# Patient Record
Sex: Male | Born: 1980 | Race: White | Hispanic: No | Marital: Single | Smoking: Never smoker
Health system: Southern US, Community
[De-identification: ages and names within clinical notes are randomized; demographics above are authoritative.]

## PROBLEM LIST (undated history)

## (undated) DIAGNOSIS — N200 Calculus of kidney: Secondary | ICD-10-CM

## (undated) HISTORY — PX: LITHOTRIPSY: SUR834

---

## 2010-12-28 ENCOUNTER — Emergency Department (EMERGENCY_DEPARTMENT_HOSPITAL): Payer: BC Managed Care – PPO

## 2010-12-28 ENCOUNTER — Emergency Department (HOSPITAL_COMMUNITY): Admission: EM | Admit: 2010-12-28 | Disposition: A | Discharge status: Home / Self Care | Payer: BC Managed Care – PPO

## 2011-01-10 ENCOUNTER — Emergency Department (EMERGENCY_DEPARTMENT_HOSPITAL): Admission: EM | Admit: 2011-01-10 | Discharge: 2011-01-10 | Payer: BC Managed Care – PPO

## 2011-01-10 ENCOUNTER — Emergency Department (EMERGENCY_DEPARTMENT_HOSPITAL): Payer: BC Managed Care – PPO

## 2011-05-01 ENCOUNTER — Encounter (FREE_STANDING_LABORATORY_FACILITY)
Admit: 2011-05-01 | Discharge: 2011-05-01 | Disposition: A | Discharge status: Home / Self Care | Payer: Self-pay | Attending: Physician Assistant | Admitting: Physician Assistant

## 2011-05-01 DIAGNOSIS — Z7251 High risk heterosexual behavior: Secondary | ICD-10-CM | POA: Insufficient documentation

## 2011-05-02 LAB — COMPREHENSIVE METABOLIC PANEL, NON-FASTING
ALBUMIN: 4.3 g/dL (ref 3.5–4.8)
ALKALINE PHOSPHATASE: 79 U/L (ref 38–126)
ALT (SGPT): 32 U/L (ref 7–55)
ANION GAP: 7 mmol/L (ref 5–16)
AST (SGOT): 23 U/L (ref 8–48)
BILIRUBIN, TOTAL: 0.5 mg/dL (ref 0.3–1.3)
BUN/CREAT RATIO: 8 (ref 6–22)
BUN: 7 mg/dL — ABNORMAL LOW (ref 8–26)
CALCIUM: 9.7 mg/dL (ref 8.5–10.4)
CARBON DIOXIDE: 28 mmol/L (ref 23–33)
CHLORIDE: 105 mmol/L (ref 96–111)
CREATININE: 0.84 mg/dL (ref 0.62–1.27)
ESTIMATED GLOMERULAR FILTRATION RATE: >59 ml/min/1.73m2 (ref >59)
GLUCOSE,NONFAST: 86 mg/dL (ref 65–139)
POTASSIUM: 4.1 mmol/L (ref 3.5–5.1)
SODIUM: 140 mmol/L (ref 136–145)
TOTAL PROTEIN: 7.4 g/dL (ref 6.4–8.3)

## 2011-05-02 LAB — HIV1/HIV2 SCREEN, COMBINED ANTIGEN AND ANTIBODY: HIV SCREEN, COMBINED ANTIGEN & ANTIBODY: NEGATIVE

## 2011-05-02 LAB — HEPATITIS C ANTIBODY SCREEN WITH REFLEX TO HCV PCR: HEPATITIS C ANTIBODY: NEGATIVE

## 2017-09-17 ENCOUNTER — Encounter (HOSPITAL_COMMUNITY): Payer: Self-pay

## 2017-09-17 ENCOUNTER — Emergency Department
Admission: EM | Admit: 2017-09-17 | Discharge: 2017-09-17 | Disposition: A | Discharge status: Home / Self Care | Payer: Self-pay | Attending: Emergency Medicine | Admitting: Emergency Medicine

## 2017-09-17 ENCOUNTER — Emergency Department (HOSPITAL_COMMUNITY): Payer: Self-pay

## 2017-09-17 DIAGNOSIS — Z87442 Personal history of urinary calculi: Secondary | ICD-10-CM | POA: Insufficient documentation

## 2017-09-17 DIAGNOSIS — N200 Calculus of kidney: Secondary | ICD-10-CM | POA: Insufficient documentation

## 2017-09-17 DIAGNOSIS — F17200 Nicotine dependence, unspecified, uncomplicated: Secondary | ICD-10-CM | POA: Insufficient documentation

## 2017-09-17 DIAGNOSIS — F17210 Nicotine dependence, cigarettes, uncomplicated: Secondary | ICD-10-CM

## 2017-09-17 HISTORY — DX: Calculus of kidney: N20.0

## 2017-09-17 LAB — GOLD TOP TUBE

## 2017-09-17 LAB — COMPREHENSIVE METABOLIC PANEL, NON-FASTING
ALBUMIN: 4.2 g/dL (ref 3.5–4.8)
ALKALINE PHOSPHATASE: 71 U/L (ref 38–126)
ALT (SGPT): 50 U/L (ref 17–63)
ANION GAP: 11 mmol/L (ref 4–13)
AST (SGOT): 34 U/L (ref 15–41)
BILIRUBIN TOTAL: 0.7 mg/dL (ref 0.2–2.0)
BUN: 15 mg/dL (ref 8–20)
CALCIUM: 9.3 mg/dL (ref 8.9–10.3)
CHLORIDE: 108 mmol/L (ref 101–111)
CO2 TOTAL: 20 mmol/L — ABNORMAL LOW (ref 22–32)
CREATININE: 1.05 mg/dL (ref 0.80–1.40)
ESTIMATED GFR: >60 mL/min/1.73mˆ2 (ref >60)
GLUCOSE: 111 mg/dL — ABNORMAL HIGH (ref 70–99)
POTASSIUM: 4.2 mmol/L (ref 3.6–5.1)
PROTEIN TOTAL: 7.3 g/dL (ref 6.1–7.9)
SODIUM: 139 mmol/L (ref 135–145)

## 2017-09-17 LAB — CBC WITH DIFF
BASOPHIL #: 0 x10ˆ3/uL (ref 0.00–0.30)
BASOPHIL %: 1 % (ref 0–1)
EOSINOPHIL #: 0.1 x10ˆ3/uL (ref 0.00–0.50)
EOSINOPHIL %: 1 % (ref 0–4)
HCT: 42 % (ref 42.0–52.0)
HCT: 42 % (ref 42.0–52.0)
HGB: 14.8 g/dL (ref 13.5–18.0)
LYMPHOCYTE #: 1.6 x10ˆ3/uL (ref 0.90–2.90)
LYMPHOCYTE %: 26 % (ref 23–35)
MCH: 29.5 pg (ref 28.0–33.0)
MCHC: 35.2 g/dL (ref 32.0–37.0)
MCV: 83.8 fL (ref 78.0–100.0)
MONOCYTE #: 0.4 x10ˆ3/uL (ref 0.30–0.90)
MONOCYTE %: 6 % (ref 0–12)
NEUTROPHIL #: 4.1 x10ˆ3/uL (ref 1.70–7.00)
NEUTROPHIL %: 66 % (ref 50–70)
NEUTROPHIL %: 66 % (ref 50–70)
PLATELETS: 336 x10ˆ3/uL (ref 130–400)
RBC: 5.02 x10ˆ6/uL (ref 4.70–5.40)
RDW: 13.8 % (ref 9.9–16.5)
WBC: 6.3 x10ˆ3/uL (ref 4.5–11.0)

## 2017-09-17 LAB — URINALYSIS, MACROSCOPIC
BILIRUBIN: NEGATIVE mg/dL
GLUCOSE: NEGATIVE mg/dL
NITRITE: NEGATIVE
PH: >8 — ABNORMAL HIGH (ref 4.5–8.0)
PROTEIN: 30 mg/dL — AB
SPECIFIC GRAVITY: 1.015 (ref 1.001–1.035)
UROBILINOGEN: 0.2 mg/dL (ref 0.2–1.0)

## 2017-09-17 LAB — URINALYSIS, MICROSCOPIC

## 2017-09-17 MED ORDER — SODIUM CHLORIDE 0.9 % IV BOLUS
1000.00 mL | INJECTION | Status: AC
Start: 2017-09-17 — End: 2017-09-17
  Administered 2017-09-17: 0 mL via INTRAVENOUS
  Administered 2017-09-17: 1000 mL via INTRAVENOUS

## 2017-09-17 MED ORDER — KETOROLAC 30 MG/ML (1 ML) INJECTION SOLUTION
INTRAMUSCULAR | Status: AC
Start: 2017-09-17 — End: 2017-09-17
  Filled 2017-09-17: qty 1

## 2017-09-17 MED ORDER — KETOROLAC 30 MG/ML (1 ML) INJECTION SOLUTION
30.00 mg | INTRAMUSCULAR | Status: AC
Start: 2017-09-17 — End: 2017-09-17

## 2017-09-17 MED ORDER — ONDANSETRON HCL (PF) 4 MG/2 ML INJECTION SOLUTION
INTRAMUSCULAR | Status: AC
Start: 2017-09-17 — End: 2017-09-17
  Filled 2017-09-17: qty 2

## 2017-09-17 MED ORDER — PHENAZOPYRIDINE 100 MG TABLET
ORAL_TABLET | ORAL | Status: AC
Start: 2017-09-17 — End: 2017-09-17
  Filled 2017-09-17: qty 2

## 2017-09-17 MED ORDER — SULFAMETHOXAZOLE 800 MG-TRIMETHOPRIM 160 MG TABLET
1.00 | ORAL_TABLET | Freq: Two times a day (BID) | ORAL | 0 refills | Status: AC
Start: 2017-09-17 — End: 2017-09-20

## 2017-09-17 MED ORDER — HYDROCODONE 7.5 MG-ACETAMINOPHEN 325 MG TABLET
1.00 | ORAL_TABLET | Freq: Four times a day (QID) | ORAL | 0 refills | Status: AC | PRN
Start: 2017-09-17 — End: ?

## 2017-09-17 MED ORDER — ONDANSETRON HCL (PF) 4 MG/2 ML INJECTION SOLUTION
4.00 mg | INTRAMUSCULAR | Status: AC
Start: 2017-09-17 — End: 2017-09-17
  Administered 2017-09-17: 4 mg via INTRAVENOUS

## 2017-09-17 MED ORDER — SODIUM CHLORIDE 0.9 % INTRAVENOUS SOLUTION
INTRAVENOUS | Status: AC
Start: 2017-09-17 — End: 2017-09-17
  Filled 2017-09-17: qty 1000

## 2017-09-17 MED ORDER — PHENAZOPYRIDINE 100 MG TABLET
200.00 mg | ORAL_TABLET | ORAL | Status: AC
Start: 2017-09-17 — End: 2017-09-17
  Administered 2017-09-17: 200 mg via ORAL

## 2017-09-17 MED ORDER — TAMSULOSIN 0.4 MG CAPSULE: 0 mg | Cap | Freq: Every evening | ORAL | 0 refills | 0 days | Status: AC

## 2017-09-17 MED ADMIN — ADULT CUSTOM PARENTERAL NUTRITION: ORAL | @ 10:00:00 | NDC 00338064406

## 2017-09-17 NOTE — ED Triage Notes (Signed)
pt states that he has a kidney stone, nausea , pain

## 2017-09-17 NOTE — ED Nurses Note (Signed)
Discharge instructions and prescription for Flomax, Bactrim, and Norco reviewed with patient. Patient verbalized understanding.Offered opportunity for patient to ask questions; answered all of patient's questions. Patient left department ambulatory. Gait strong and steady.

## 2017-09-17 NOTE — ED Provider Notes (Signed)
William DykesDaniel P Sullivan  08-Sep-1980  37 y.o.  male    Chief Complaint:   Chief Complaint   Patient presents with   . Kidney Stone       HPI: This is a 37 y.o. male who presents to the emergency department with complaints of acute abdominal pain that started last night.  The patient has a long history of kidney stones and he has run out of town.  The patient says his last kidney stone was about 3 or 4 years ago and has had this several times prior.  Patient is nauseous and is vomiting but says his pain is mostly on his right.  Patient also says that he has had some trouble urinating any pre sure this is a kidney stone as he has had this many times before.  No fevers, no chills, and no bowel habit changes.          Past Medical History:   Past Medical History:   Diagnosis Date   . Kidney stones        Past Surgical History:   Past Surgical History:   Procedure Laterality Date   . Lithotripsy         Social History:   Social History     Tobacco Use   . Smoking status: Current Some Day Smoker   . Smokeless tobacco: Never Used   Substance Use Topics   . Alcohol use: Never     Frequency: Never   . Drug use: Never      Social History     Substance and Sexual Activity   Drug Use Never       Allergies: No Known Allergies      Review of Systems: 10 systems reviewed and negative except as noted in HPI.      Physical Exam    General:  Patient alert and oriented x4.  No acute distress.  BP 131/67   Pulse 77   Temp 36.1 C (97 F)   Resp 16   Ht 1.778 m (5\' 10" )   Wt 86.2 kg (190 lb)   SpO2 98%   BMI 27.26 kg/m         HEENT:  Head:  Normocephalic, atraumatic.  Eyes: Normal conjunctiva. Oropharynx: Oral mucosa is moist.     Neck: Supple.  No lymphadenopathy.    Respiratory:  Chest clear to auscultation bilaterally. No rales, rhonchi, or wheezes.  No respiratory distress.     Cardiovascular:  Heart regular rate and rhythm without murmurs, rubs, or gallops.      Abdomen: Soft, non-tender, bowel sounds positive. No masses or  organomegaly.  CVA tenderness on the right more than on the left, he has no peritoneal signs or focal tenderness in his abdomen    Extremities:  Normal pulses. No clubbing, cyanosis, or edema.    MS:  No ROM deficit.  Normal strength.     Neuro: Alert and oriented x4.  No focal motor or sensory deficits.      Skin:  Warm and dry. No rashes or lesions.        Medical Decision Making:   Patient was triaged, vital signs were obtained, patient was  placed in a room.  I did examine the patient.  After examining the patient, I did do a full workup for this patient since he tells a very good story about kidney stones.  I also got a UA that showed that the patient has possible UTI for which I  prescribed antibiotic.  The CT scan of patient showed that he had 2 obstructing renal calculi with dilatation proximal to these kidney stones.  Urologist is not on call today but I did make an appointment for the patient to be seen later this week by his office.  I discussed the findings with the patient and also told him that I will give him Flomax along with Norco for pain and to return to the emergency department immediately if he has any ongoing issues or new symptoms.            Clinical Impression:   Encounter Diagnosis   Name Primary?   . Kidney stone on left side Yes           Disposition: Discharged             Procedures

## 2017-09-19 LAB — URINE CULTURE,ROUTINE: URINE CULTURE: NO GROWTH

## 2018-02-09 ENCOUNTER — Emergency Department (HOSPITAL_COMMUNITY): Payer: Managed Care, Other (non HMO)

## 2018-02-09 ENCOUNTER — Emergency Department (HOSPITAL_COMMUNITY)
Admission: EM | Admit: 2018-02-09 | Discharge: 2018-02-09 | Disposition: A | Discharge status: Home / Self Care | Payer: Managed Care, Other (non HMO) | Attending: Emergency Medicine | Admitting: Emergency Medicine

## 2018-02-09 ENCOUNTER — Encounter (HOSPITAL_COMMUNITY): Payer: Self-pay | Admitting: Emergency Medicine

## 2018-02-09 DIAGNOSIS — K2901 Acute gastritis with bleeding: Secondary | ICD-10-CM

## 2018-02-09 DIAGNOSIS — N23 Unspecified renal colic: Secondary | ICD-10-CM

## 2018-02-09 DIAGNOSIS — Z79899 Other long term (current) drug therapy: Secondary | ICD-10-CM | POA: Insufficient documentation

## 2018-02-09 DIAGNOSIS — R109 Unspecified abdominal pain: Secondary | ICD-10-CM | POA: Diagnosis present

## 2018-02-09 LAB — CBC
HEMATOCRIT: 44.2 % (ref 39.0–52.0)
HEMOGLOBIN: 14.6 g/dL (ref 13.0–17.0)
MCH: 28.5 pg (ref 26.0–34.0)
MCHC: 33 g/dL (ref 30.0–36.0)
MCV: 86.3 fL (ref 78.0–100.0)
Platelets: 271 10*3/uL (ref 150–400)
RBC: 5.12 MIL/uL (ref 4.22–5.81)
RDW: 12.7 % (ref 11.5–15.5)
WBC: 5.7 10*3/uL (ref 4.0–10.5)

## 2018-02-09 LAB — URINALYSIS, ROUTINE W REFLEX MICROSCOPIC
BACTERIA UA: NONE SEEN
BILIRUBIN URINE: NEGATIVE
Glucose, UA: NEGATIVE mg/dL
KETONES UR: NEGATIVE mg/dL
LEUKOCYTES UA: NEGATIVE
NITRITE: NEGATIVE
Protein, ur: NEGATIVE mg/dL
Specific Gravity, Urine: 1.008 (ref 1.005–1.030)
pH: 7 (ref 5.0–8.0)

## 2018-02-09 LAB — COMPREHENSIVE METABOLIC PANEL
ALBUMIN: 4.3 g/dL (ref 3.5–5.0)
ALT: 53 U/L — ABNORMAL HIGH (ref 0–44)
ANION GAP: 11 (ref 5–15)
AST: 34 U/L (ref 15–41)
Alkaline Phosphatase: 75 U/L (ref 38–126)
BILIRUBIN TOTAL: 0.7 mg/dL (ref 0.3–1.2)
BUN: 8 mg/dL (ref 6–20)
CHLORIDE: 106 mmol/L (ref 98–111)
CO2: 22 mmol/L (ref 22–32)
Calcium: 9.6 mg/dL (ref 8.9–10.3)
Creatinine, Ser: 1.08 mg/dL (ref 0.61–1.24)
GFR calc Af Amer: >60 mL/min (ref >60)
GFR calc non Af Amer: >60 mL/min (ref >60)
GLUCOSE: 132 mg/dL — AB (ref 70–99)
POTASSIUM: 4.1 mmol/L (ref 3.5–5.1)
Sodium: 139 mmol/L (ref 135–145)
TOTAL PROTEIN: 7.3 g/dL (ref 6.5–8.1)

## 2018-02-09 LAB — LIPASE, BLOOD: Lipase: 73 U/L — ABNORMAL HIGH (ref 11–51)

## 2018-02-09 MED ORDER — ONDANSETRON HCL 4 MG/2ML IJ SOLN
4.0000 mg | Freq: Once | INTRAMUSCULAR | Status: AC
  Administered 2018-02-09: 4 mg via INTRAVENOUS
  Filled 2018-02-09: qty 2

## 2018-02-09 MED ORDER — IOPAMIDOL (ISOVUE-300) INJECTION 61%
INTRAVENOUS | Status: AC
  Administered 2018-02-09: 100 mL
  Filled 2018-02-09: qty 100

## 2018-02-09 MED ORDER — TAMSULOSIN HCL 0.4 MG PO CAPS: 0.4000 mg | ORAL_CAPSULE | Freq: Every day | ORAL | 0 refills | Status: AC

## 2018-02-09 MED ORDER — ONDANSETRON HCL 4 MG PO TABS: 4.0000 mg | ORAL_TABLET | Freq: Four times a day (QID) | ORAL | 0 refills | Status: AC

## 2018-02-09 MED ORDER — FAMOTIDINE IN NACL 20-0.9 MG/50ML-% IV SOLN
20.0000 mg | Freq: Once | INTRAVENOUS | Status: AC
  Administered 2018-02-09: 20 mg via INTRAVENOUS
  Filled 2018-02-09: qty 50

## 2018-02-09 MED ORDER — OMEPRAZOLE 40 MG PO CPDR: 40.0000 mg | DELAYED_RELEASE_CAPSULE | Freq: Every day | ORAL | 0 refills | Status: AC

## 2018-02-09 MED ORDER — SUCRALFATE 1 GM/10ML PO SUSP: 1.0000 g | Freq: Three times a day (TID) | ORAL | 0 refills | Status: AC

## 2018-02-09 MED ORDER — SODIUM CHLORIDE 0.9 % IV BOLUS
1000.0000 mL | Freq: Once | INTRAVENOUS | Status: AC
  Administered 2018-02-09: 1000 mL via INTRAVENOUS

## 2018-02-09 MED ORDER — HYDROCODONE-ACETAMINOPHEN 5-325 MG PO TABS: 1.0000 | ORAL_TABLET | Freq: Four times a day (QID) | ORAL | 0 refills | Status: AC | PRN

## 2018-02-09 NOTE — ED Notes (Signed)
ED Provider at bedside. 

## 2018-02-09 NOTE — ED Notes (Signed)
Pt aware of need for UA. Urinal at bedside.

## 2018-02-09 NOTE — ED Triage Notes (Signed)
Pt. Stated, I woke this morning throwing up and pain on the right lower part of my stomach.

## 2018-02-09 NOTE — ED Notes (Signed)
Patient verbalizes understanding of discharge instructions. Opportunity for questioning and answers were provided. Armband removed by staff, pt discharged from ED.  

## 2018-02-09 NOTE — ED Notes (Signed)
Family at bedside. 

## 2018-02-09 NOTE — ED Provider Notes (Signed)
[facility] EMERGENCY DEPARTMENT Provider Note   CSN: [id number] Arrival date & time: 02/09/18  0746     History   Chief Complaint Chief Complaint  Patient presents with  . Emesis  . Nausea  . Abdominal Pain    HPI Ronald Nunez is a 37 y.o. male.  The history is provided by the patient. No language interpreter was used.  Emesis   Associated symptoms include abdominal pain.  Abdominal Pain   Associated symptoms include vomiting.   Ronald Nunez is a 37 y.o. male who presents to the Emergency Department complaining of abdominal pain, vomiting. This morning at 6 AM he awoke with right lower quadrant abdominal pain. He states the pain is difficult to describe. It is constant in nature but does improve after he vomits. He has had seven episodes of emesis. He describes the emesis as bile mixed with a small amount of blood. His first episode of emesis did have blood mixed in. He denies any chest pain, fevers, dysuria, hematuria. He has a history of kidney stones but this is different from his kidney stone episodes. He denies any alcohol or drug use. Symptoms are moderate and constant in nature. History reviewed. No pertinent past medical history.  There are no active problems to display for this patient.   History reviewed. No pertinent surgical history.      Home Medications    Prior to Admission medications   Medication Sig Start Date End Date Taking? Authorizing Provider  OVER THE COUNTER MEDICATION Take 1 tablet by mouth See admin instructions. Take 1 tablet Burn HD by mouth daily for burning energy   Yes [provider]  vitamin C (ASCORBIC ACID) 250 MG tablet Take 250 mg by mouth daily.   Yes [provider]  HYDROcodone-acetaminophen (NORCO/VICODIN) 5-325 MG tablet Take 1 tablet by mouth every 6 (six) hours as needed. 02/09/18   Tilden Fossa, MD  omeprazole (PRILOSEC) 40 MG capsule Take 1 capsule (40 mg total) by mouth daily. 02/09/18    Tilden Fossa, MD  ondansetron (ZOFRAN) 4 MG tablet Take 1 tablet (4 mg total) by mouth every 6 (six) hours. 02/09/18   Tilden Fossa, MD  sucralfate (CARAFATE) 1 GM/10ML suspension Take 10 mLs (1 g total) by mouth 4 (four) times daily -  with meals and at bedtime. 02/09/18   Tilden Fossa, MD  tamsulosin (FLOMAX) 0.4 MG CAPS capsule Take 1 capsule (0.4 mg total) by mouth daily. 02/09/18   Tilden Fossa, MD    Family History No family history on file.  Social History Social History   Tobacco Use  . Smoking status: Never Smoker  . Smokeless tobacco: Never Used  Substance Use Topics  . Alcohol use: Not Currently  . Drug use: Not Currently     Allergies   Patient has no allergy information on record.   Review of Systems Review of Systems  Gastrointestinal: Positive for abdominal pain and vomiting.  All other systems reviewed and are negative.    Physical Exam Updated Vital Signs BP 133/86   Pulse 66   Temp 97.8 F (36.6 C) (Oral)   Resp 14   Ht 5\' 10"  (1.778 m)   Wt 90.7 kg   SpO2 98%   BMI 28.70 kg/m   Physical Exam  Constitutional: He is oriented to person, place, and time. He appears well-developed and well-nourished.  HENT:  Head: Normocephalic and atraumatic.  Cardiovascular: Normal rate and regular rhythm.  No murmur heard. Pulmonary/Chest: Effort  normal and breath sounds normal. No respiratory distress.  Abdominal: Soft. There is no rebound and no guarding.  Mild right lower quadrant tenderness without guarding or rebound  Musculoskeletal: He exhibits no edema or tenderness.  Neurological: He is alert and oriented to person, place, and time.  Skin: Skin is warm and dry.  Psychiatric: He has a normal mood and affect. His behavior is normal.  Nursing note and vitals reviewed.    ED Treatments / Results  Labs (all labs ordered are listed, but only abnormal results are displayed) Labs Reviewed  LIPASE, BLOOD - Abnormal; Notable for the following  components:      Result Value   Lipase 73 (*)    All other components within normal limits  COMPREHENSIVE METABOLIC PANEL - Abnormal; Notable for the following components:   Glucose, Bld 132 (*)    ALT 53 (*)    All other components within normal limits  URINALYSIS, ROUTINE W REFLEX MICROSCOPIC - Abnormal; Notable for the following components:   Hgb urine dipstick MODERATE (*)    All other components within normal limits  CBC    EKG None  Radiology Ct Abdomen Pelvis W Contrast  Result Date: 02/09/2018 CLINICAL DATA:  Right lower quadrant pain with nausea and vomiting. EXAM: CT ABDOMEN AND PELVIS WITH CONTRAST TECHNIQUE: Multidetector CT imaging of the abdomen and pelvis was performed using the standard protocol following bolus administration of intravenous contrast. CONTRAST:  ISOVUE-300 IOPAMIDOL (ISOVUE-300) INJECTION 61% COMPARISON:  None. FINDINGS: Lower chest: No acute abnormality. Hepatobiliary: Diffuse hepatic steatosis. No focal liver abnormality. The gallbladder is unremarkable. No biliary dilatation. Pancreas: Unremarkable. No pancreatic ductal dilatation or surrounding inflammatory changes. Spleen: Normal in size without focal abnormality. Adrenals/Urinary Tract: The adrenal glands are unremarkable. Multiple right-sided renal calculi measuring up to 5 mm. Several punctate left renal calculi. 5 mm calculus in the proximal right ureter with resultant mild right hydronephrosis. 1.4 cm simple cyst in the left kidney. The bladder is unremarkable for the degree of distention. Stomach/Bowel: Stomach is within normal limits. Appendix appears normal. No evidence of bowel wall thickening, distention, or inflammatory changes. Vascular/Lymphatic: No significant vascular findings are present. No enlarged abdominal or pelvic lymph nodes. Reproductive: Prostate is unremarkable. Other: Tiny fat containing left inguinal and umbilical hernias. No free fluid or pneumoperitoneum. Musculoskeletal: No  acute or significant osseous findings. IMPRESSION: 1. 5 mm calculus in the proximal right ureter with resultant mild right hydronephrosis. 2. Additional bilateral nonobstructive nephrolithiasis. 3. Normal appendix. 4. Hepatic steatosis. Electronically Signed   By: Obie Dredge M.D.   On: 02/09/2018 10:18    Procedures Procedures (including critical care time)  Medications Ordered in ED Medications  sodium chloride 0.9 % bolus 1,000 mL (0 mLs Intravenous Stopped 02/09/18 1028)  ondansetron (ZOFRAN) injection 4 mg (4 mg Intravenous Given 02/09/18 0847)  famotidine (PEPCID) IVPB 20 mg premix (0 mg Intravenous Stopped 02/09/18 0940)  iopamidol (ISOVUE-300) 61 % injection (100 mLs  Contrast Given 02/09/18 0945)     Initial Impression / Assessment and Plan / ED Course  I have reviewed the triage vital signs and the nursing notes.  Pertinent labs & imaging results that were available during my care of the patient were reviewed by me and considered in my medical decision making (see chart for details).     Pt here for evaluation of right lower quadrant pain, hematemesis. His pain is reasonably controlled in the emergency department and he declines any pain medications. His nausea is also  well controlled. CT abdomen demonstrates a 5 mm proximal ureteral stone. There is no evidence of acute kidney injury or urinary tract infection. CBC is with normal hemoglobin. He does not have any recurrent hematemesis in the department. No concerning features for perforated duodenal ulcer, variceal bleeding, Borrehaves esophagus. Discussed with patient home care for renal colic as well as gastritis. Discussed importance of urology as well as G.I. follow-up. Return precautions discussed.  Final Clinical Impressions(s) / ED Diagnoses   Final diagnoses:  Renal colic  Acute gastritis with hemorrhage, unspecified gastritis type    ED Discharge Orders         Ordered    ondansetron (ZOFRAN) 4 MG tablet  Every 6 hours      02/09/18 1122    omeprazole (PRILOSEC) 40 MG capsule  Daily     02/09/18 1122    sucralfate (CARAFATE) 1 GM/10ML suspension  3 times daily with meals & bedtime     02/09/18 1122    tamsulosin (FLOMAX) 0.4 MG CAPS capsule  Daily     02/09/18 1122    HYDROcodone-acetaminophen (NORCO/VICODIN) 5-325 MG tablet  Every 6 hours PRN     02/09/18 1122           Tilden Fossa, MD 02/09/18 1130

## 2018-02-09 NOTE — Discharge Instructions (Addendum)
You have a 5 mm stone in your right ureter.  Get rechecked immediately if you develop fevers, can't urinate or new concerning symptoms.

## 2019-02-09 IMAGING — CT CT ABD-PELV W/ CM
2 of 4 series · 16 of 46 positions shown, 18 images · IV contrast (iopamidol)
Comparison: None.

CLINICAL DATA: Right lower quadrant pain with nausea and vomiting.

EXAM:
CT ABDOMEN AND PELVIS WITH CONTRAST
TECHNIQUE: Multidetector CT imaging of the abdomen and pelvis was performed
using the standard protocol following bolus administration of
intravenous contrast.
CONTRAST:  100mL AYDBI7-ELL IOPAMIDOL (AYDBI7-ELL) INJECTION 61%

[Series 3: abdomen 5.0 · axial · 0.73mm/px · z∈[+714,+1154]mm · 13 of 100 slices shown, 15 images]
[im 6/100  soft-tissue]
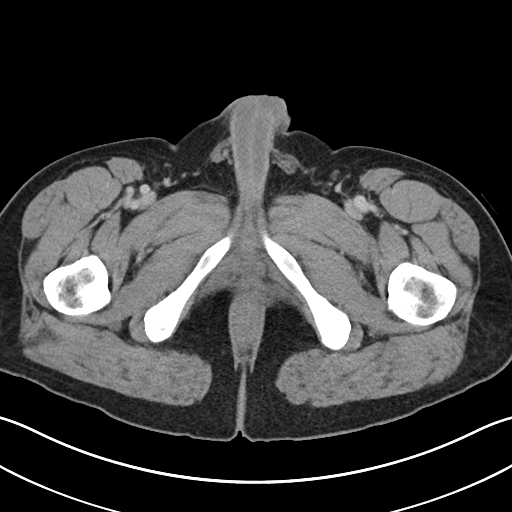
[im 6/100  bone]
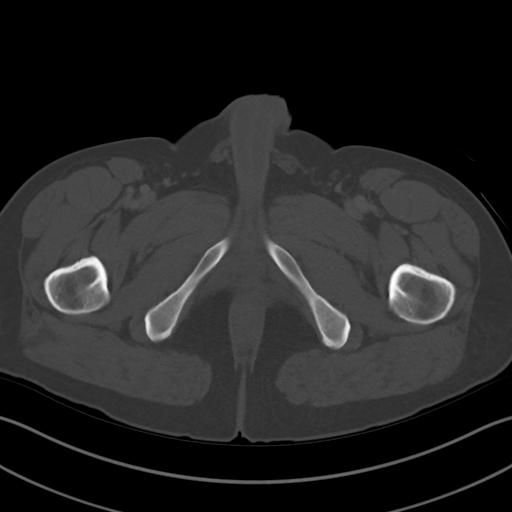
[im 12/100  soft-tissue]
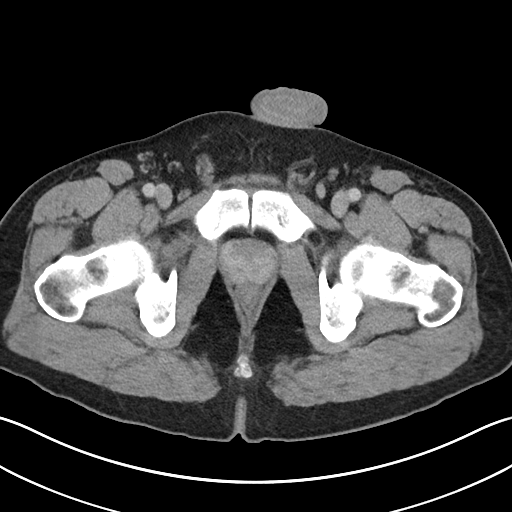
[im 24/100  soft-tissue]
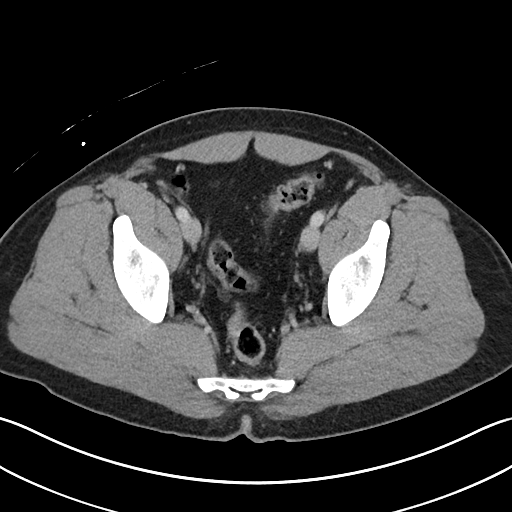
[im 30/100  soft-tissue]
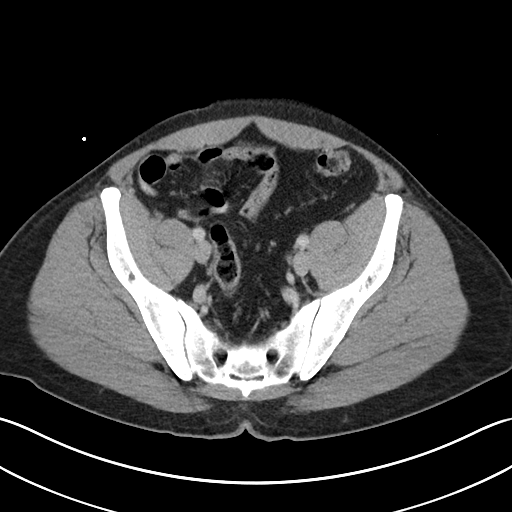
[im 35/100  soft-tissue]
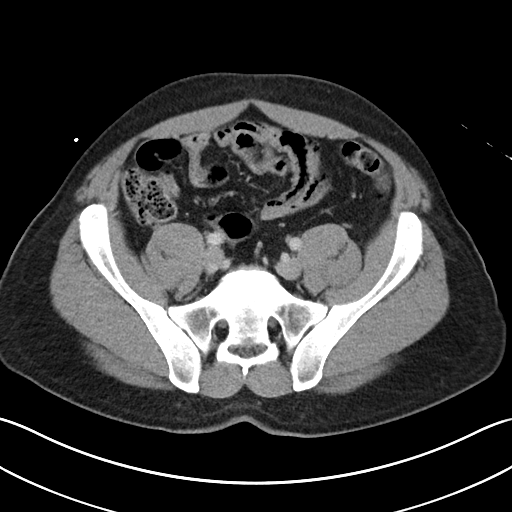
[im 41/100  soft-tissue]
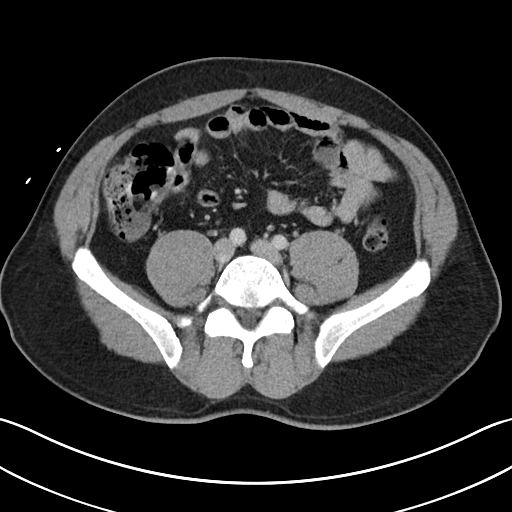
[im 53/100  soft-tissue]
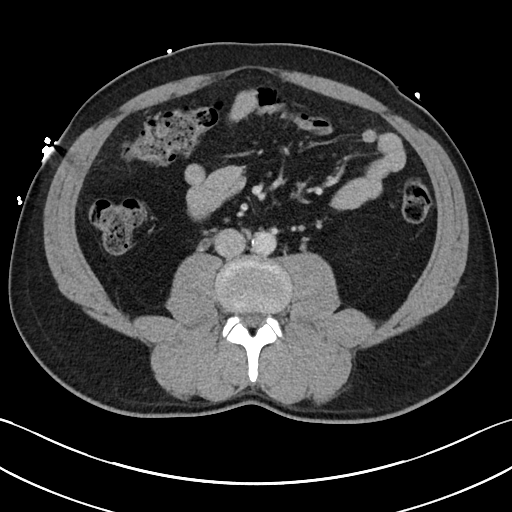
[im 59/100  soft-tissue]
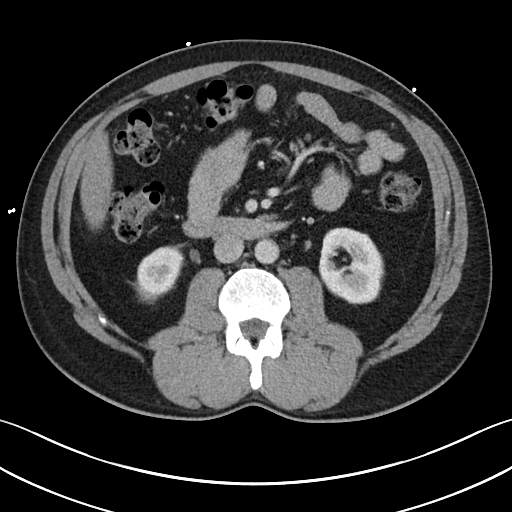
[im 65/100  soft-tissue]
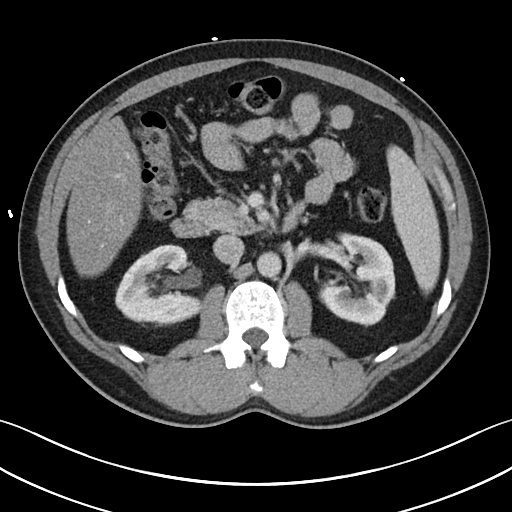
[im 65/100  bone]
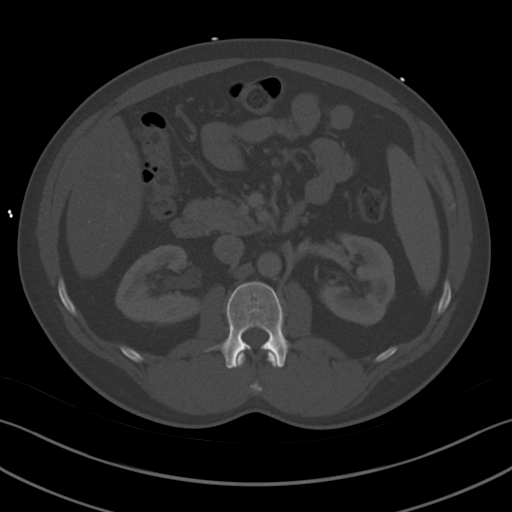
[im 70/100  soft-tissue]
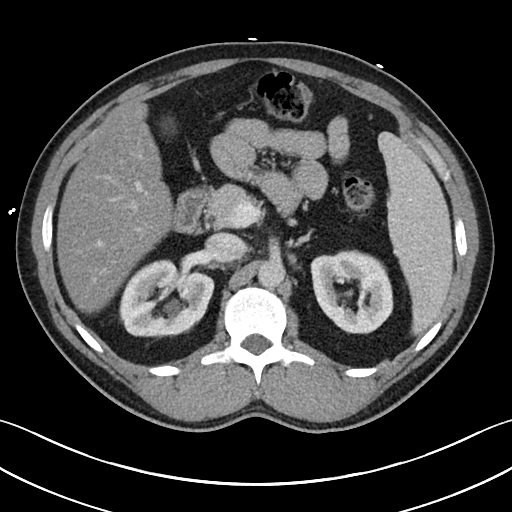
[im 76/100  soft-tissue]
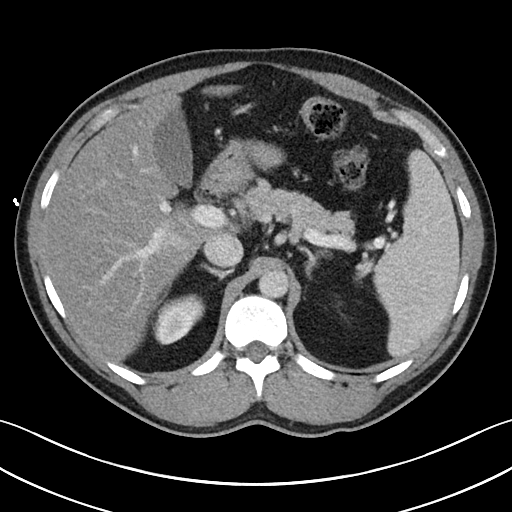
[im 88/100  soft-tissue]
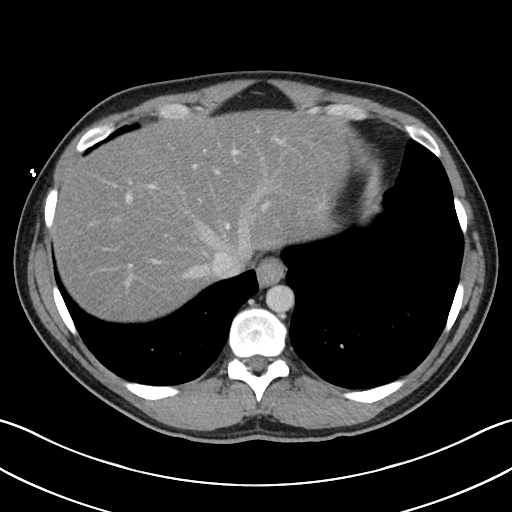
[im 94/100  soft-tissue]
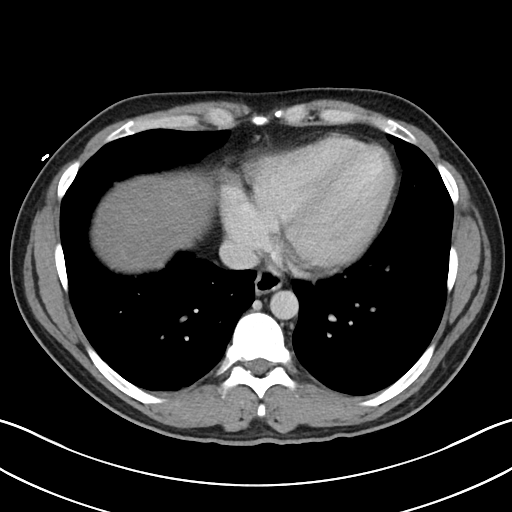

[Series 6: abdomen 3.0 mpr cor · coronal · 0.77mm/px · 3 of 90 slices shown]
[im 30/90  soft-tissue]
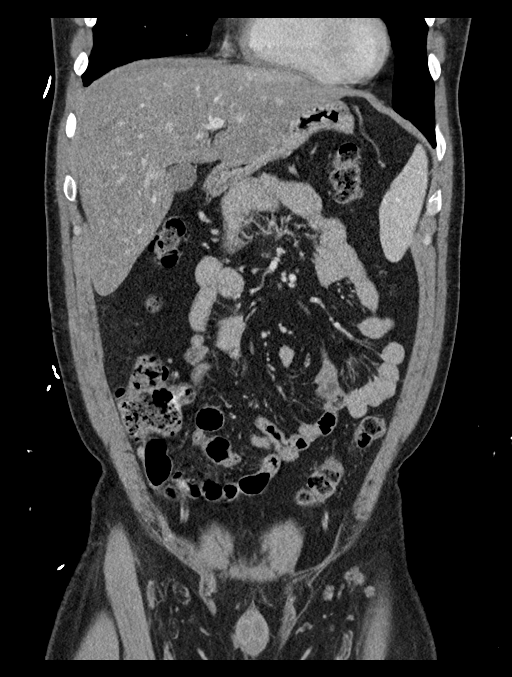
[im 40/90  soft-tissue]
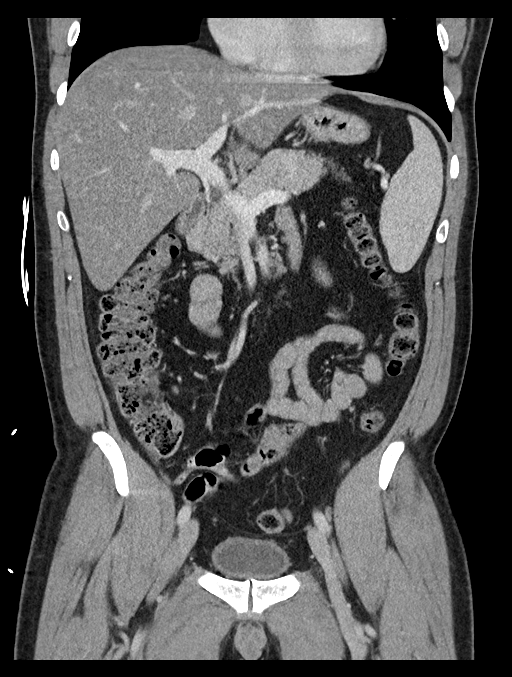
[im 50/90  soft-tissue]
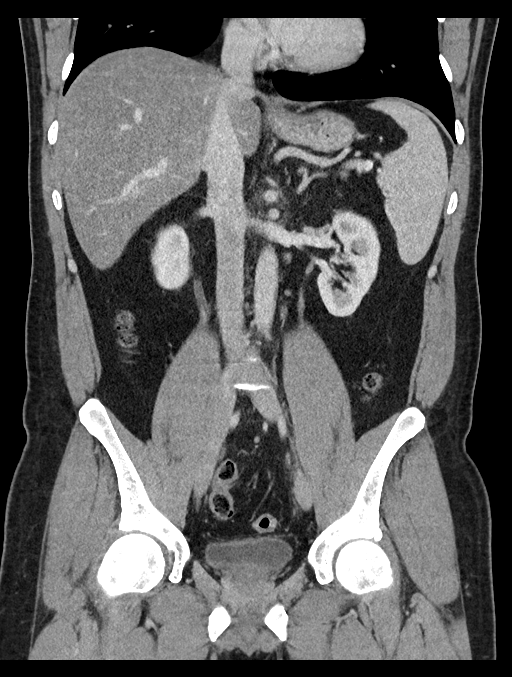

[16 of 46 positions shown; findings below may reference images not displayed]

FINDINGS: Lower chest: No acute abnormality.

Hepatobiliary: Diffuse hepatic steatosis. No focal liver
abnormality. The gallbladder is unremarkable. No biliary dilatation.

Pancreas: Unremarkable. No pancreatic ductal dilatation or
surrounding inflammatory changes.

Spleen: Normal in size without focal abnormality.

Adrenals/Urinary Tract: The adrenal glands are unremarkable.
Multiple right-sided renal calculi measuring up to 5 mm. Several
punctate left renal calculi. 5 mm calculus in the proximal right
ureter with resultant mild right hydronephrosis. 1.4 cm simple cyst
in the left kidney. The bladder is unremarkable for the degree of
distention.

Stomach/Bowel: Stomach is within normal limits. Appendix appears
normal. No evidence of bowel wall thickening, distention, or
inflammatory changes.

Vascular/Lymphatic: No significant vascular findings are present. No
enlarged abdominal or pelvic lymph nodes.

Reproductive: Prostate is unremarkable.

Other: Tiny fat containing left inguinal and umbilical hernias. No
free fluid or pneumoperitoneum.

Musculoskeletal: No acute or significant osseous findings.
IMPRESSION: 1. 5 mm calculus in the proximal right ureter with resultant mild
right hydronephrosis.
2. Additional bilateral nonobstructive nephrolithiasis.
3. Normal appendix.
4. Hepatic steatosis.
